# Patient Record
Sex: Male | Born: 1985 | Race: White | Hispanic: No | Marital: Single | Smoking: Current some day smoker
Health system: Southern US, Community
[De-identification: ages and names within clinical notes are randomized; demographics above are authoritative.]

---

## 2016-08-13 ENCOUNTER — Emergency Department (HOSPITAL_BASED_OUTPATIENT_CLINIC_OR_DEPARTMENT_OTHER): Payer: Worker's Compensation

## 2016-08-13 ENCOUNTER — Inpatient Hospital Stay (HOSPITAL_COMMUNITY): Payer: Worker's Compensation | Admitting: Anesthesiology

## 2016-08-13 ENCOUNTER — Encounter (HOSPITAL_COMMUNITY): Admission: EM | Disposition: A | Discharge status: Home / Self Care | Payer: Self-pay | Source: Home / Self Care

## 2016-08-13 ENCOUNTER — Inpatient Hospital Stay (HOSPITAL_BASED_OUTPATIENT_CLINIC_OR_DEPARTMENT_OTHER)
Admission: EM | Admit: 2016-08-13 | Discharge: 2016-08-15 | DRG: 958 | Disposition: A | Discharge status: Home / Self Care | Payer: Worker's Compensation | Attending: General Surgery | Admitting: General Surgery

## 2016-08-13 ENCOUNTER — Encounter (HOSPITAL_BASED_OUTPATIENT_CLINIC_OR_DEPARTMENT_OTHER): Payer: Self-pay | Admitting: *Deleted

## 2016-08-13 ENCOUNTER — Other Ambulatory Visit (HOSPITAL_BASED_OUTPATIENT_CLINIC_OR_DEPARTMENT_OTHER): Payer: Self-pay | Admitting: Radiology

## 2016-08-13 DIAGNOSIS — S27321A Contusion of lung, unilateral, initial encounter: Secondary | ICD-10-CM

## 2016-08-13 DIAGNOSIS — S52302A Unspecified fracture of shaft of left radius, initial encounter for closed fracture: Secondary | ICD-10-CM

## 2016-08-13 DIAGNOSIS — S52352A Displaced comminuted fracture of shaft of radius, left arm, initial encounter for closed fracture: Principal | ICD-10-CM | POA: Diagnosis present

## 2016-08-13 DIAGNOSIS — T1490XA Injury, unspecified, initial encounter: Secondary | ICD-10-CM

## 2016-08-13 DIAGNOSIS — S2242XA Multiple fractures of ribs, left side, initial encounter for closed fracture: Secondary | ICD-10-CM | POA: Diagnosis present

## 2016-08-13 DIAGNOSIS — S52252A Displaced comminuted fracture of shaft of ulna, left arm, initial encounter for closed fracture: Secondary | ICD-10-CM | POA: Diagnosis present

## 2016-08-13 DIAGNOSIS — S270XXA Traumatic pneumothorax, initial encounter: Secondary | ICD-10-CM | POA: Diagnosis present

## 2016-08-13 DIAGNOSIS — S20312A Abrasion of left front wall of thorax, initial encounter: Secondary | ICD-10-CM | POA: Diagnosis present

## 2016-08-13 DIAGNOSIS — F17200 Nicotine dependence, unspecified, uncomplicated: Secondary | ICD-10-CM | POA: Diagnosis present

## 2016-08-13 DIAGNOSIS — S63115A Dislocation of metacarpophalangeal joint of left thumb, initial encounter: Secondary | ICD-10-CM | POA: Diagnosis present

## 2016-08-13 DIAGNOSIS — T07XXXA Unspecified multiple injuries, initial encounter: Secondary | ICD-10-CM | POA: Diagnosis present

## 2016-08-13 DIAGNOSIS — S40812A Abrasion of left upper arm, initial encounter: Secondary | ICD-10-CM

## 2016-08-13 DIAGNOSIS — S5012XA Contusion of left forearm, initial encounter: Secondary | ICD-10-CM

## 2016-08-13 DIAGNOSIS — S30811A Abrasion of abdominal wall, initial encounter: Secondary | ICD-10-CM

## 2016-08-13 DIAGNOSIS — S36031A Moderate laceration of spleen, initial encounter: Secondary | ICD-10-CM | POA: Diagnosis present

## 2016-08-13 DIAGNOSIS — W208XXA Other cause of strike by thrown, projected or falling object, initial encounter: Secondary | ICD-10-CM | POA: Diagnosis present

## 2016-08-13 DIAGNOSIS — S50812A Abrasion of left forearm, initial encounter: Secondary | ICD-10-CM

## 2016-08-13 DIAGNOSIS — S301XXA Contusion of abdominal wall, initial encounter: Secondary | ICD-10-CM

## 2016-08-13 DIAGNOSIS — S36039A Unspecified laceration of spleen, initial encounter: Secondary | ICD-10-CM | POA: Diagnosis present

## 2016-08-13 DIAGNOSIS — S52202A Unspecified fracture of shaft of left ulna, initial encounter for closed fracture: Secondary | ICD-10-CM

## 2016-08-13 HISTORY — PX: OPEN REDUCTION INTERNAL FIXATION (ORIF) DISTAL RADIAL FRACTURE: SHX5989

## 2016-08-13 LAB — CBC
HEMATOCRIT: 41.3 % (ref 39.0–52.0)
HEMATOCRIT: 38.2 % — AB (ref 39.0–52.0)
Hemoglobin: 14.1 g/dL (ref 13.0–17.0)
Hemoglobin: 12.4 g/dL — ABNORMAL LOW (ref 13.0–17.0)
MCH: 30.3 pg (ref 26.0–34.0)
MCH: 28.7 pg (ref 26.0–34.0)
MCHC: 34.1 g/dL (ref 30.0–36.0)
MCHC: 32.5 g/dL (ref 30.0–36.0)
MCV: 88.6 fL (ref 78.0–100.0)
MCV: 88.4 fL (ref 78.0–100.0)
PLATELETS: 175 10*3/uL (ref 150–400)
Platelets: 173 10*3/uL (ref 150–400)
RBC: 4.66 MIL/uL (ref 4.22–5.81)
RBC: 4.32 MIL/uL (ref 4.22–5.81)
RDW: 13.5 % (ref 11.5–15.5)
RDW: 13.2 % (ref 11.5–15.5)
WBC: 14.7 10*3/uL — AB (ref 4.0–10.5)
WBC: 11.3 10*3/uL — ABNORMAL HIGH (ref 4.0–10.5)

## 2016-08-13 LAB — CBC WITH DIFFERENTIAL/PLATELET
Basophils Absolute: 0 10*3/uL (ref 0.0–0.1)
Basophils Relative: 0 %
EOS PCT: 1 %
Eosinophils Absolute: 0.2 10*3/uL (ref 0.0–0.7)
HCT: 43.7 % (ref 39.0–52.0)
HEMOGLOBIN: 15.2 g/dL (ref 13.0–17.0)
LYMPHS ABS: 5.4 10*3/uL — AB (ref 0.7–4.0)
Lymphocytes Relative: 35 %
MCH: 30 pg (ref 26.0–34.0)
MCHC: 34.8 g/dL (ref 30.0–36.0)
MCV: 86.4 fL (ref 78.0–100.0)
MONOS PCT: 5 %
Monocytes Absolute: 0.8 10*3/uL (ref 0.1–1.0)
Neutro Abs: 9.1 10*3/uL — ABNORMAL HIGH (ref 1.7–7.7)
Neutrophils Relative %: 59 %
PLATELETS: 306 10*3/uL (ref 150–400)
RBC: 5.06 MIL/uL (ref 4.22–5.81)
RDW: 13.2 % (ref 11.5–15.5)
WBC: 15.5 10*3/uL — AB (ref 4.0–10.5)

## 2016-08-13 LAB — SURGICAL PCR SCREEN
MRSA, PCR: NEGATIVE
STAPHYLOCOCCUS AUREUS: NEGATIVE

## 2016-08-13 LAB — BASIC METABOLIC PANEL
Anion gap: 9 (ref 5–15)
BUN: 21 mg/dL — AB (ref 6–20)
CHLORIDE: 106 mmol/L (ref 101–111)
CO2: 25 mmol/L (ref 22–32)
Calcium: 9.1 mg/dL (ref 8.9–10.3)
Creatinine, Ser: 1.13 mg/dL (ref 0.61–1.24)
GFR calc Af Amer: >60 mL/min (ref >60)
GFR calc non Af Amer: >60 mL/min (ref >60)
Glucose, Bld: 157 mg/dL — ABNORMAL HIGH (ref 65–99)
POTASSIUM: 3.3 mmol/L — AB (ref 3.5–5.1)
Sodium: 140 mmol/L (ref 135–145)

## 2016-08-13 LAB — MRSA PCR SCREENING: MRSA by PCR: NEGATIVE

## 2016-08-13 SURGERY — OPEN REDUCTION INTERNAL FIXATION (ORIF) DISTAL RADIUS FRACTURE
Anesthesia: Regional | Site: Arm Lower | Laterality: Left

## 2016-08-13 MED ORDER — PROPOFOL 10 MG/ML IV BOLUS
INTRAVENOUS | Status: AC
  Filled 2016-08-13: qty 20

## 2016-08-13 MED ORDER — FENTANYL CITRATE (PF) 100 MCG/2ML IJ SOLN
100.0000 ug | Freq: Once | INTRAMUSCULAR | Status: AC
  Administered 2016-08-13: 100 ug via INTRAVENOUS
  Filled 2016-08-13: qty 2

## 2016-08-13 MED ORDER — CEFAZOLIN SODIUM-DEXTROSE 1-4 GM/50ML-% IV SOLN
1.0000 g | Freq: Four times a day (QID) | INTRAVENOUS | Status: AC
  Administered 2016-08-13 – 2016-08-14 (×3): 1 g via INTRAVENOUS
  Filled 2016-08-13 (×4): qty 50

## 2016-08-13 MED ORDER — ONDANSETRON HCL 4 MG/2ML IJ SOLN: 4.0000 mg | Freq: Four times a day (QID) | INTRAMUSCULAR | Status: DC | PRN

## 2016-08-13 MED ORDER — SODIUM CHLORIDE 0.9 % IV SOLN: INTRAVENOUS | Status: DC

## 2016-08-13 MED ORDER — ONDANSETRON HCL 4 MG PO TABS: 4.0000 mg | ORAL_TABLET | Freq: Four times a day (QID) | ORAL | Status: DC | PRN

## 2016-08-13 MED ORDER — IOPAMIDOL (ISOVUE-300) INJECTION 61%
100.0000 mL | Freq: Once | INTRAVENOUS | Status: AC | PRN
  Administered 2016-08-13: 100 mL via INTRAVENOUS

## 2016-08-13 MED ORDER — FENTANYL CITRATE (PF) 100 MCG/2ML IJ SOLN
50.0000 ug | Freq: Once | INTRAMUSCULAR | Status: AC
  Administered 2016-08-13: 50 ug via INTRAVENOUS

## 2016-08-13 MED ORDER — LIDOCAINE HCL (CARDIAC) 20 MG/ML IV SOLN
INTRAVENOUS | Status: DC | PRN
Start: 2016-08-13 — End: 2016-08-13
  Administered 2016-08-13: 40 mg via INTRAVENOUS

## 2016-08-13 MED ORDER — BUPIVACAINE HCL (PF) 0.25 % IJ SOLN
INTRAMUSCULAR | Status: AC
  Filled 2016-08-13: qty 30

## 2016-08-13 MED ORDER — FENTANYL CITRATE (PF) 100 MCG/2ML IJ SOLN
INTRAMUSCULAR | Status: AC
  Administered 2016-08-13: 50 ug via INTRAVENOUS
  Filled 2016-08-13: qty 2

## 2016-08-13 MED ORDER — CEFAZOLIN SODIUM-DEXTROSE 2-4 GM/100ML-% IV SOLN
2.0000 g | INTRAVENOUS | Status: DC
  Filled 2016-08-13: qty 100

## 2016-08-13 MED ORDER — LACTATED RINGERS IV SOLN
INTRAVENOUS | Status: DC | PRN
  Administered 2016-08-13 (×2): via INTRAVENOUS

## 2016-08-13 MED ORDER — MIDAZOLAM HCL 2 MG/2ML IJ SOLN
1.0000 mg | Freq: Once | INTRAMUSCULAR | Status: AC
  Administered 2016-08-13: 1 mg via INTRAVENOUS

## 2016-08-13 MED ORDER — FENTANYL CITRATE (PF) 250 MCG/5ML IJ SOLN
INTRAMUSCULAR | Status: AC
  Filled 2016-08-13: qty 5

## 2016-08-13 MED ORDER — MIDAZOLAM HCL 2 MG/2ML IJ SOLN
INTRAMUSCULAR | Status: AC
  Filled 2016-08-13: qty 2

## 2016-08-13 MED ORDER — LIDOCAINE 2% (20 MG/ML) 5 ML SYRINGE
INTRAMUSCULAR | Status: AC
  Filled 2016-08-13: qty 5

## 2016-08-13 MED ORDER — PROPOFOL 10 MG/ML IV BOLUS
INTRAVENOUS | Status: DC | PRN
  Administered 2016-08-13: 150 mg via INTRAVENOUS

## 2016-08-13 MED ORDER — FENTANYL CITRATE (PF) 100 MCG/2ML IJ SOLN
100.0000 ug | INTRAMUSCULAR | Status: DC | PRN
  Administered 2016-08-13 (×2): 100 ug via INTRAVENOUS
  Filled 2016-08-13 (×2): qty 2

## 2016-08-13 MED ORDER — FENTANYL CITRATE (PF) 100 MCG/2ML IJ SOLN
INTRAMUSCULAR | Status: DC | PRN
  Administered 2016-08-13 (×3): 50 ug via INTRAVENOUS

## 2016-08-13 MED ORDER — BUPIVACAINE-EPINEPHRINE (PF) 0.5% -1:200000 IJ SOLN
INTRAMUSCULAR | Status: DC | PRN
  Administered 2016-08-13: 30 mL via PERINEURAL

## 2016-08-13 MED ORDER — METOCLOPRAMIDE HCL 5 MG PO TABS: 5.0000 mg | ORAL_TABLET | Freq: Three times a day (TID) | ORAL | Status: DC | PRN

## 2016-08-13 MED ORDER — KCL IN DEXTROSE-NACL 20-5-0.45 MEQ/L-%-% IV SOLN
INTRAVENOUS | Status: DC
  Administered 2016-08-13 – 2016-08-15 (×3): via INTRAVENOUS
  Filled 2016-08-13 (×6): qty 1000

## 2016-08-13 MED ORDER — PNEUMOCOCCAL VAC POLYVALENT 25 MCG/0.5ML IJ INJ
0.5000 mL | INJECTION | INTRAMUSCULAR | Status: AC
  Administered 2016-08-14: 0.5 mL via INTRAMUSCULAR
  Filled 2016-08-13: qty 0.5

## 2016-08-13 MED ORDER — HYDROMORPHONE HCL 1 MG/ML IJ SOLN
1.0000 mg | INTRAMUSCULAR | Status: DC | PRN
  Administered 2016-08-13 – 2016-08-15 (×10): 1 mg via INTRAVENOUS
  Filled 2016-08-13 (×10): qty 1

## 2016-08-13 MED ORDER — MIDAZOLAM HCL 5 MG/5ML IJ SOLN
INTRAMUSCULAR | Status: DC | PRN
  Administered 2016-08-13: 2 mg via INTRAVENOUS

## 2016-08-13 MED ORDER — POVIDONE-IODINE 10 % EX SWAB
2.0000 "application " | Freq: Once | CUTANEOUS | Status: AC
  Administered 2016-08-13: 2 via TOPICAL

## 2016-08-13 MED ORDER — ONDANSETRON HCL 4 MG/2ML IJ SOLN
INTRAMUSCULAR | Status: AC
  Filled 2016-08-13: qty 2

## 2016-08-13 MED ORDER — 0.9 % SODIUM CHLORIDE (POUR BTL) OPTIME
TOPICAL | Status: DC | PRN
  Administered 2016-08-13: 1000 mL

## 2016-08-13 MED ORDER — OXYCODONE HCL 5 MG PO TABS
10.0000 mg | ORAL_TABLET | ORAL | Status: DC | PRN
  Administered 2016-08-14 – 2016-08-15 (×8): 10 mg via ORAL
  Filled 2016-08-13 (×8): qty 2

## 2016-08-13 MED ORDER — NICOTINE 14 MG/24HR TD PT24
14.0000 mg | MEDICATED_PATCH | Freq: Every day | TRANSDERMAL | Status: DC
  Administered 2016-08-13 – 2016-08-15 (×3): 14 mg via TRANSDERMAL
  Filled 2016-08-13 (×3): qty 1

## 2016-08-13 MED ORDER — CEFAZOLIN SODIUM-DEXTROSE 1-4 GM/50ML-% IV SOLN
1.0000 g | Freq: Once | INTRAVENOUS | Status: AC
  Administered 2016-08-13: 1 g via INTRAVENOUS
  Filled 2016-08-13: qty 50

## 2016-08-13 MED ORDER — MIDAZOLAM HCL 2 MG/2ML IJ SOLN
INTRAMUSCULAR | Status: AC
  Administered 2016-08-13: 1 mg via INTRAVENOUS
  Filled 2016-08-13: qty 2

## 2016-08-13 MED ORDER — ONDANSETRON HCL 4 MG/2ML IJ SOLN
INTRAMUSCULAR | Status: DC | PRN
Start: 2016-08-13 — End: 2016-08-13
  Administered 2016-08-13: 4 mg via INTRAVENOUS

## 2016-08-13 MED ORDER — ONDANSETRON HCL 4 MG/2ML IJ SOLN
4.0000 mg | Freq: Once | INTRAMUSCULAR | Status: AC
  Administered 2016-08-13: 4 mg via INTRAVENOUS
  Filled 2016-08-13: qty 2

## 2016-08-13 MED ORDER — PANTOPRAZOLE SODIUM 40 MG PO TBEC
40.0000 mg | DELAYED_RELEASE_TABLET | Freq: Every day | ORAL | Status: DC
  Administered 2016-08-14 – 2016-08-15 (×2): 40 mg via ORAL
  Filled 2016-08-13 (×2): qty 1

## 2016-08-13 MED ORDER — OXYCODONE HCL 5 MG PO TABS: 5.0000 mg | ORAL_TABLET | ORAL | Status: DC | PRN

## 2016-08-13 MED ORDER — CHLORHEXIDINE GLUCONATE 4 % EX LIQD
60.0000 mL | Freq: Once | CUTANEOUS | Status: AC
  Administered 2016-08-13: 4 via TOPICAL
  Filled 2016-08-13: qty 60

## 2016-08-13 MED ORDER — HYDROMORPHONE HCL 1 MG/ML IJ SOLN
0.2500 mg | INTRAMUSCULAR | Status: DC | PRN
Start: 2016-08-13 — End: 2016-08-13

## 2016-08-13 MED ORDER — METOCLOPRAMIDE HCL 5 MG/ML IJ SOLN: 5.0000 mg | Freq: Three times a day (TID) | INTRAMUSCULAR | Status: DC | PRN

## 2016-08-13 MED ORDER — SODIUM CHLORIDE 0.9 % IV SOLN
Freq: Once | INTRAVENOUS | Status: AC
  Administered 2016-08-13: 02:00:00 via INTRAVENOUS

## 2016-08-13 MED ORDER — PANTOPRAZOLE SODIUM 40 MG IV SOLR
40.0000 mg | Freq: Every day | INTRAVENOUS | Status: DC
  Administered 2016-08-13: 40 mg via INTRAVENOUS
  Filled 2016-08-13: qty 40

## 2016-08-13 MED ORDER — ACETAMINOPHEN 325 MG PO TABS: 650.0000 mg | ORAL_TABLET | ORAL | Status: DC | PRN

## 2016-08-13 SURGICAL SUPPLY — 65 items
BANDAGE ACE 3X5.8 VEL STRL LF (GAUZE/BANDAGES/DRESSINGS) ×3 IMPLANT
BANDAGE ACE 4X5 VEL STRL LF (GAUZE/BANDAGES/DRESSINGS) ×3 IMPLANT
BIT DRILL 2.8 QUICK RELEASE (BIT) ×1 IMPLANT
BLADE CLIPPER SURG (BLADE) IMPLANT
BNDG ESMARK 4X9 LF (GAUZE/BANDAGES/DRESSINGS) ×3 IMPLANT
BNDG GAUZE ELAST 4 BULKY (GAUZE/BANDAGES/DRESSINGS) ×3 IMPLANT
CORDS BIPOLAR (ELECTRODE) ×3 IMPLANT
COVER SURGICAL LIGHT HANDLE (MISCELLANEOUS) ×3 IMPLANT
CUFF TOURNIQUET SINGLE 18IN (TOURNIQUET CUFF) ×3 IMPLANT
CUFF TOURNIQUET SINGLE 24IN (TOURNIQUET CUFF) IMPLANT
DECANTER SPIKE VIAL GLASS SM (MISCELLANEOUS) IMPLANT
DRAIN TLS ROUND 10FR (DRAIN) IMPLANT
DRAPE OEC MINIVIEW 54X84 (DRAPES) IMPLANT
DRAPE SURG 17X23 STRL (DRAPES) ×3 IMPLANT
DRILL 2.8 QUICK RELEASE (BIT) ×3
GAUZE SPONGE 4X4 12PLY STRL (GAUZE/BANDAGES/DRESSINGS) ×3 IMPLANT
GAUZE SPONGE 4X4 12PLY STRL LF (GAUZE/BANDAGES/DRESSINGS) ×3 IMPLANT
GAUZE SPONGE 4X4 16PLY XRAY LF (GAUZE/BANDAGES/DRESSINGS) ×3 IMPLANT
GAUZE XEROFORM 1X8 LF (GAUZE/BANDAGES/DRESSINGS) ×3 IMPLANT
GLOVE BIO SURGEON STRL SZ7.5 (GLOVE) ×3 IMPLANT
GLOVE BIOGEL PI IND STRL 8 (GLOVE) ×1 IMPLANT
GLOVE BIOGEL PI INDICATOR 8 (GLOVE) ×2
GOWN STRL REUS W/ TWL LRG LVL3 (GOWN DISPOSABLE) ×3 IMPLANT
GOWN STRL REUS W/ TWL XL LVL3 (GOWN DISPOSABLE) ×3 IMPLANT
GOWN STRL REUS W/TWL LRG LVL3 (GOWN DISPOSABLE) ×6
GOWN STRL REUS W/TWL XL LVL3 (GOWN DISPOSABLE) ×6
KIT BASIN OR (CUSTOM PROCEDURE TRAY) ×3 IMPLANT
KIT ROOM TURNOVER OR (KITS) ×3 IMPLANT
LOOP VESSEL MAXI BLUE (MISCELLANEOUS) IMPLANT
MANIFOLD NEPTUNE II (INSTRUMENTS) ×3 IMPLANT
NEEDLE 22X1 1/2 (OR ONLY) (NEEDLE) IMPLANT
NS IRRIG 1000ML POUR BTL (IV SOLUTION) ×3 IMPLANT
PACK ORTHO EXTREMITY (CUSTOM PROCEDURE TRAY) ×3 IMPLANT
PAD ARMBOARD 7.5X6 YLW CONV (MISCELLANEOUS) ×6 IMPLANT
PAD CAST 4YDX4 CTTN HI CHSV (CAST SUPPLIES) ×1 IMPLANT
PADDING CAST ABS 4INX4YD NS (CAST SUPPLIES) ×2
PADDING CAST ABS COTTON 4X4 ST (CAST SUPPLIES) ×1 IMPLANT
PADDING CAST COTTON 4X4 STRL (CAST SUPPLIES) ×2
PLATE 8 HOLE RADIUS (Plate) ×3 IMPLANT
PLATE ULNA 10HOLE (Plate) ×3 IMPLANT
PLATE ULNAR 130 10H LCKG (Plate) ×1 IMPLANT
SCREW BONE CORT 3.5X17MM HEXA (Screw) ×2 IMPLANT
SCREW CORTICAL 3.5X17 (Screw) ×6 IMPLANT
SCREW HEX 3.5X15 NLCKG STRL (Screw) ×3 IMPLANT
SCREW HEX 3.5X15MM (Screw) ×9 IMPLANT
SCREW HEXALOBE NON-LOCK 3.5X14 (Screw) ×9 IMPLANT
SCREW HEXALOBE NON-LOCK 3.5X16 (Screw) ×6 IMPLANT
SCREW NLCKG 13 3.5X13 HEXA (Screw) ×3 IMPLANT
SCREW NON-LOCK 3.5X13 (Screw) ×9 IMPLANT
SPLINT PLASTER EXTRA FAST 3X15 (CAST SUPPLIES) ×2
SPLINT PLASTER GYPS XFAST 3X15 (CAST SUPPLIES) ×1 IMPLANT
SPONGE LAP 4X18 X RAY DECT (DISPOSABLE) IMPLANT
SUT ETHILON 3 0 PS 1 (SUTURE) ×15 IMPLANT
SUT MNCRL AB 4-0 PS2 18 (SUTURE) ×3 IMPLANT
SUT PROLENE 3 0 PS 2 (SUTURE) IMPLANT
SUT VIC AB 3-0 FS2 27 (SUTURE) ×3 IMPLANT
SYR CONTROL 10ML LL (SYRINGE) IMPLANT
SYSTEM CHEST DRAIN TLS 7FR (DRAIN) IMPLANT
TOWEL OR 17X24 6PK STRL BLUE (TOWEL DISPOSABLE) ×3 IMPLANT
TOWEL OR 17X26 10 PK STRL BLUE (TOWEL DISPOSABLE) ×3 IMPLANT
TUBE CONNECTING 12'X1/4 (SUCTIONS) ×1
TUBE CONNECTING 12X1/4 (SUCTIONS) ×2 IMPLANT
TUBE EVACUATION TLS (MISCELLANEOUS) ×3 IMPLANT
UNDERPAD 30X30 (UNDERPADS AND DIAPERS) ×3 IMPLANT
WATER STERILE IRR 1000ML POUR (IV SOLUTION) ×3 IMPLANT

## 2016-08-13 NOTE — Anesthesia Postprocedure Evaluation (Addendum)
Anesthesia Post Note  Patient: Charlyn MinervaDevin R Lagasse  Procedure(s) Performed: Procedure(s) (LRB): OPEN REDUCTION INTERNAL FIXATION (ORIF) DISTAL RADIAL FRACTURE AND ULNA , CLOSED vs OPEN REDUCTION OF THUMB (Left)  Patient location during evaluation: PACU Anesthesia Type: Regional Level of consciousness: awake and alert Pain management: pain level controlled Vital Signs Assessment: post-procedure vital signs reviewed and stable Respiratory status: spontaneous breathing, nonlabored ventilation, respiratory function stable and patient connected to nasal cannula oxygen Cardiovascular status: blood pressure returned to baseline and stable Postop Assessment: no signs of nausea or vomiting Anesthetic complications: no       Last Vitals:  Vitals:   08/13/16 1700 08/13/16 1800  BP: 114/65 112/63  Pulse: 80 70  Resp: 18 16  Temp: 36.8 C     Last Pain:  Vitals:   08/13/16 1700  TempSrc: Oral  PainSc: 0-No pain                 Halynn Reitano

## 2016-08-13 NOTE — ED Provider Notes (Addendum)
[facility] Provider Note: Lowella Dell, MD, FACEP  CSN: [id number] MRN: [id number] ARRIVAL: 08/13/16 at 0149 ROOM: [facility]10/[facility]10   CHIEF COMPLAINT  Arm Injury and Rib Injury   HISTORY OF PRESENT ILLNESS  Arthur Clark is a 31 y.o. male who was struck by a falling aluminum in get at work about 45 minutes prior to arrival. [employer]. weighs about 165 pounds. In get struck him in his left axilla, the left side of his chest and abdomen, and his left arm and hand. He is having severe pain in his left chest, worse with deep breathing or palpation. He is having moderate pain in his left flank. He is having moderate to severe pain in his left forearm.  There are multiple abrasions of the left axilla and left side of the chest. There is a hematoma and abrasion to the left abdomen with associated tenderness. There is an obvious deformity of the mid left forearm with tense, soft tissue swelling proximal to the deformity. There is a deformity and tenderness of the base of the left thumb. Sensation is intact distally except for the left thumb. Tendon function evaluation is limited due to pain and swelling. He denies neck pain, mid back pain or injury to his right upper extremity or lower extremities.   History reviewed. No pertinent past medical history.  History reviewed. No pertinent surgical history.  History reviewed. No pertinent family history.  Social History  Substance Use Topics  . Smoking status: Current Some Day Smoker  . Smokeless tobacco: Never Used  . Alcohol use No    Prior to Admission medications   Not on File    Allergies Patient has no known allergies.   REVIEW OF SYSTEMS  Negative except as noted here or in the History of Present Illness.   PHYSICAL EXAMINATION  Initial Vital Signs Blood pressure 139/77, pulse (!) 58, temperature 98.3 F (36.8 C), resp. rate 14, height 6\' 4"  (1.93 m), weight 200 lb (90.7 kg), SpO2 99 %.  Examination General:  Well-developed, well-nourished male in no acute distress; appearance consistent with age of record HENT: normocephalic; atraumatic Eyes: pupils equal, round and reactive to light; extraocular muscles intact Neck: supple; no C-spine tenderness Heart: regular rate and rhythm Lungs: clear to auscultation bilaterally Chest: Left axillary and lateral chest wall abrasions and tenderness without crepitus:    Abdomen: soft; nondistended; no masses or hepatosplenomegaly; bowel sounds present; left-sided tenderness with left flank abrasion and hematoma:   Extremities: Mid left forearm deformity with generalized tenderness, soft tissue swelling and tenseness proximal to the deformity, deformity of base of left thumb with decreased range of motion, left radial artery +2 with decreased capillary refill distally:   Neurologic: Awake, alert and oriented; motor function intact in all extremities and symmetric; no facial droop; sensation intact in left second through fourth fingers but decreased in the left thumb Skin: Warm and dry; abrasions of left upper arm; trunk abrasions and hematoma noted above Psychiatric: Normal mood and affect   RESULTS  Summary of this visit's results, reviewed by myself:   EKG Interpretation  Date/Time:    Ventricular Rate:    PR Interval:    QRS Duration:   QT Interval:    QTC Calculation:   R Axis:     Text Interpretation:        Laboratory Studies: Results for orders placed or performed during the hospital encounter of 08/13/16 (from the past 24 hour(s))  CBC with Differential/Platelet  Status: Abnormal   Collection Time: 08/13/16  2:12 AM  Result Value Ref Range   WBC 15.5 (H) 4.0 - 10.5 K/uL   RBC 5.06 4.22 - 5.81 MIL/uL   Hemoglobin 15.2 13.0 - 17.0 g/dL   HCT 16.1 09.6 - 04.5 %   MCV 86.4 78.0 - 100.0 fL   MCH 30.0 26.0 - 34.0 pg   MCHC 34.8 30.0 - 36.0 g/dL   RDW 40.9 81.1 - 91.4 %   Platelets 306 150 - 400 K/uL   Neutrophils Relative % 59 %     Neutro Abs 9.1 (H) 1.7 - 7.7 K/uL   Lymphocytes Relative 35 %   Lymphs Abs 5.4 (H) 0.7 - 4.0 K/uL   Monocytes Relative 5 %   Monocytes Absolute 0.8 0.1 - 1.0 K/uL   Eosinophils Relative 1 %   Eosinophils Absolute 0.2 0.0 - 0.7 K/uL   Basophils Relative 0 %   Basophils Absolute 0.0 0.0 - 0.1 K/uL  Basic metabolic panel     Status: Abnormal   Collection Time: 08/13/16  2:12 AM  Result Value Ref Range   Sodium 140 135 - 145 mmol/L   Potassium 3.3 (L) 3.5 - 5.1 mmol/L   Chloride 106 101 - 111 mmol/L   CO2 25 22 - 32 mmol/L   Glucose, Bld 157 (H) 65 - 99 mg/dL   BUN 21 (H) 6 - 20 mg/dL   Creatinine, Ser 7.82 0.61 - 1.24 mg/dL   Calcium 9.1 8.9 - 95.6 mg/dL   GFR calc non Af Amer >60 >60 mL/min   GFR calc Af Amer >60 >60 mL/min   Anion gap 9 5 - 15   Imaging Studies: Dg Forearm Left  Result Date: 08/13/2016 CLINICAL DATA:  Construction injury EXAM: LEFT FOREARM - 2 VIEW COMPARISON:  None. FINDINGS: There are comminuted fractures of the midshaft left radius and ulna, predominantly oblique, with volar angulation. IMPRESSION: Comminuted fractures of the midshaft left radius and ulna with volar angulation. Electronically Signed   By: Deatra Robinson M.D.   On: 08/13/2016 03:44   Ct Chest W Contrast  Result Date: 08/13/2016 CLINICAL DATA:  Blunt trauma with enlarged pieces of metal fell on top of the patient. Shortness of breath and difficulty breathing. Multiple lacerations and pain on the left side of the body. EXAM: CT CHEST, ABDOMEN, AND PELVIS WITH CONTRAST TECHNIQUE: Multidetector CT imaging of the chest, abdomen and pelvis was performed following the standard protocol during bolus administration of intravenous contrast. CONTRAST:  ISOVUE-300 IOPAMIDOL (ISOVUE-300) INJECTION 61% COMPARISON:  None. FINDINGS: CT CHEST FINDINGS Cardiovascular: No significant vascular findings. Normal heart size. No pericardial effusion. Mediastinum/Nodes: No enlarged mediastinal, hilar, or axillary  lymph nodes. Thyroid gland, trachea, and esophagus demonstrate no significant findings. Lungs/Pleura: There are left basilar opacities subjacent to multiple rib fractures, likely pulmonary contusion. There is a trace left anterior pneumothorax, (series 3, image 162). This measures approximately 3 mm. Musculoskeletal: There are nondisplaced fractures of the lateral left ninth and tenth ribs with small amounts of adjacent soft tissue emphysema. CT ABDOMEN PELVIS FINDINGS Hepatobiliary: No focal liver abnormality is seen. No gallstones, gallbladder wall thickening, or biliary dilatation. Pancreas: Unremarkable. No pancreatic ductal dilatation or surrounding inflammatory changes. Spleen: There is a wedge-shaped focus of hypoattenuation at the posterior aspect of the spleen. There is a hematoma within the subcutaneous fat overlying the spleen, an a fracture of the adjacent left tenth rib. Adrenals/Urinary Tract: Adrenal glands are unremarkable. Kidneys are normal, without renal  calculi, focal lesion, or hydronephrosis. Bladder is unremarkable. Stomach/Bowel: Stomach is within normal limits. Appendix appears normal. No evidence of bowel wall thickening, distention, or inflammatory changes. Vascular/Lymphatic: No significant vascular findings are present. No enlarged abdominal or pelvic lymph nodes. Reproductive: Prostate is unremarkable. Other: Hematoma within the left flank subcutaneous fat. Musculoskeletal: There is no fracture of the pelvis. At the posterior inferior corner of the L4 vertebral body, there is a well corticated osseous fragment indenting the ventral thecal sac. This fragment is well corticated and appears chronic. No lumbar spine fracture is identified. Fractures of the left radius and ulna are better characterized on concomitant radiographs. IMPRESSION: 1. Focal hypoattenuation within the posterolateral aspect of the spleen, measuring up to 2 cm in depth, consistent with grade 2 splenic laceration. 2.  Nondisplaced fractures of the left ninth and tenth ribs with associated soft tissue emphysema and contusion of the underlying lung. 3. Trace left anterior pneumothorax measuring approximately 3 mm. 4. Corticated osseous fragment at the inferior posterior corner of the L4 vertebral body, impressing on the ventral thecal sac. This has a chronic appearance and may be related to remote trauma or herniation of the nucleus pulposis through the endplate. 5. Fractures of the left radius and ulna are better characterized on concomitant radiographs. 6. Left flank hematoma within the subcutaneous fat overlying the spleen. Critical Value/emergent results were called by telephone at the time of interpretation on 08/13/2016 at 3:40 am to Dr. Paula Libra , who verbally acknowledged these results. Electronically Signed   By: Deatra Robinson M.D.   On: 08/13/2016 03:42   Ct Abdomen Pelvis W Contrast  Result Date: 08/13/2016 CLINICAL DATA:  Blunt trauma with enlarged pieces of metal fell on top of the patient. Shortness of breath and difficulty breathing. Multiple lacerations and pain on the left side of the body. EXAM: CT CHEST, ABDOMEN, AND PELVIS WITH CONTRAST TECHNIQUE: Multidetector CT imaging of the chest, abdomen and pelvis was performed following the standard protocol during bolus administration of intravenous contrast. CONTRAST:  ISOVUE-300 IOPAMIDOL (ISOVUE-300) INJECTION 61% COMPARISON:  None. FINDINGS: CT CHEST FINDINGS Cardiovascular: No significant vascular findings. Normal heart size. No pericardial effusion. Mediastinum/Nodes: No enlarged mediastinal, hilar, or axillary lymph nodes. Thyroid gland, trachea, and esophagus demonstrate no significant findings. Lungs/Pleura: There are left basilar opacities subjacent to multiple rib fractures, likely pulmonary contusion. There is a trace left anterior pneumothorax, (series 3, image 162). This measures approximately 3 mm. Musculoskeletal: There are nondisplaced  fractures of the lateral left ninth and tenth ribs with small amounts of adjacent soft tissue emphysema. CT ABDOMEN PELVIS FINDINGS Hepatobiliary: No focal liver abnormality is seen. No gallstones, gallbladder wall thickening, or biliary dilatation. Pancreas: Unremarkable. No pancreatic ductal dilatation or surrounding inflammatory changes. Spleen: There is a wedge-shaped focus of hypoattenuation at the posterior aspect of the spleen. There is a hematoma within the subcutaneous fat overlying the spleen, an a fracture of the adjacent left tenth rib. Adrenals/Urinary Tract: Adrenal glands are unremarkable. Kidneys are normal, without renal calculi, focal lesion, or hydronephrosis. Bladder is unremarkable. Stomach/Bowel: Stomach is within normal limits. Appendix appears normal. No evidence of bowel wall thickening, distention, or inflammatory changes. Vascular/Lymphatic: No significant vascular findings are present. No enlarged abdominal or pelvic lymph nodes. Reproductive: Prostate is unremarkable. Other: Hematoma within the left flank subcutaneous fat. Musculoskeletal: There is no fracture of the pelvis. At the posterior inferior corner of the L4 vertebral body, there is a well corticated osseous fragment indenting the ventral thecal sac.  This fragment is well corticated and appears chronic. No lumbar spine fracture is identified. Fractures of the left radius and ulna are better characterized on concomitant radiographs. IMPRESSION: 1. Focal hypoattenuation within the posterolateral aspect of the spleen, measuring up to 2 cm in depth, consistent with grade 2 splenic laceration. 2. Nondisplaced fractures of the left ninth and tenth ribs with associated soft tissue emphysema and contusion of the underlying lung. 3. Trace left anterior pneumothorax measuring approximately 3 mm. 4. Corticated osseous fragment at the inferior posterior corner of the L4 vertebral body, impressing on the ventral thecal sac. This has a  chronic appearance and may be related to remote trauma or herniation of the nucleus pulposis through the endplate. 5. Fractures of the left radius and ulna are better characterized on concomitant radiographs. 6. Left flank hematoma within the subcutaneous fat overlying the spleen. Critical Value/emergent results were called by telephone at the time of interpretation on 08/13/2016 at 3:40 am to Dr. Paula LibraJOHN Jaison Petraglia , who verbally acknowledged these results. Electronically Signed   By: Deatra RobinsonKevin  Herman M.D.   On: 08/13/2016 03:42   Dg Hand Complete Left  Result Date: 08/13/2016 CLINICAL DATA:  Construction injury EXAM: LEFT HAND - COMPLETE 3+ VIEW COMPARISON:  None. FINDINGS: There is lateral dislocation at the left metacarpophalangeal joint of the thumb. There is a small adjacent osseous fragment that likely indicates fracture, but the donor site is not identified. There is no other fracture of the left hand. IMPRESSION: Lateral dislocation at the left first metacarpophalangeal joint. An associated chip fracture is suspected, but the donor site is not visualized. Electronically Signed   By: Deatra RobinsonKevin  Herman M.D.   On: 08/13/2016 03:46    ED COURSE  Nursing notes and initial vitals signs, including pulse oximetry, reviewed.  Vitals:   08/13/16 0220 08/13/16 0222 08/13/16 0330 08/13/16 0400  BP: 136/81  (!) 136/32 128/68  Pulse: (!) 55  72 61  Resp: 16  16 16   Temp:      SpO2: 100% 98% 100% 100%  Weight:      Height:       4:18 AM Discussed with Dr. Violeta GelinasBurke Thompson of trauma surgery who accepts the patient [facility]. Discussed with Dr. Betha LoaKevin Kuzma of hand surgery who will see the patient on arrival. Due to the tenseness of his left forearm soft tissue I am concerned about an impending compartment syndrome and told Dr. Merlyn LotKuzma that I anticipate he will want to perform surgery on an urgent basis. Capillary refill has not worsened since arrival  PROCEDURES   CRITICAL  CARE Performed by: Sincere Liuzzi L Total critical care time: 75 minutes Critical care time was exclusive of separately billable procedures and treating other patients. Critical care was necessary to treat or prevent imminent or life-threatening deterioration. Critical care was time spent personally by me on the following activities: development of treatment plan with patient and/or surrogate as well as nursing, discussions with consultants, evaluation of patient's response to treatment, examination of patient, obtaining history from patient or surrogate, ordering and performing treatments and interventions, ordering and review of laboratory studies, ordering and review of radiographic studies, pulse oximetry and re-evaluation of patient's condition.   Left forearm sugar tong splint applied by tech. Fingers remain neurovascularly intact to that of the initial examination.  ED DIAGNOSES     ICD-9-CM ICD-10-CM   1. Accidentally struck by falling object, initial encounter [phone] W20.8XXA   2. Trauma 959.9 T14.90XA CT  CHEST W CONTRAST     CT CHEST W CONTRAST  3. Splenic laceration, initial encounter 865.09 S36.039A   4. Fracture of ribs, two, left, closed, initial encounter 807.02 S22.42XA   5. Contusion of left lung, initial encounter 861.21 S27.321A   6. Traumatic pneumothorax without open wound into thorax, initial encounter 860.0 S27.0XXA   7. Abrasion of left chest wall, initial encounter 911.0 S20.312A   8. Abrasion of abdominal wall, initial encounter 911.0 S30.811A   9. Abdominal wall hematoma, initial encounter 922.2 S30.1XXA   10. Dislocation of metacarpophalangeal joint of left thumb, initial encounter 834.01 S63.115A   11. Traumatic hematoma of left forearm, initial encounter 923.10 S50.12XA   12. Abrasion of left forearm, initial encounter 913.0 S50.812A   13. Abrasion of left upper arm, initial encounter 912.0 S40.812A   14. Fracture, radius and ulna, shaft, left, closed, initial  encounter 813.23 S52.202A     S52.302A   15. Abrasion of left axilla, initial encounter 912.0 Z61.096E        Paula Libra, MD 08/13/16 0423    Paula Libra, MD 08/13/16 [phone]

## 2016-08-13 NOTE — ED Triage Notes (Signed)
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

## 2016-08-13 NOTE — Clinical Social Work Note (Addendum)
Clinical Social Work Assessment  Patient Details  Name: Arthur Clark MRN: [id number] Date of Birth: 05/17/1985  Date of referral:  08/13/16               Reason for consult:  Trauma                Permission sought to share information with:    Permission granted to share information::  No  Name::        Agency::     Relationship::     Contact Information:     Housing/Transportation Living arrangements for the past 2 months:  Single Family Home Source of Information:  Patient, Medical Team Patient Interpreter Needed:  None Criminal Activity/Legal Involvement Pertinent to Current Situation/Hospitalization:  No - Comment as needed Significant Relationships:  Significant Other Lives with:  Significant Other Do you feel safe going back to the place where you live?  Yes Need for family participation in patient care:  Yes (Comment)  Care giving concerns:  Trauma patient. SBIRT completed.   Social Worker assessment / plan:  CSW met with patient. No supports at bedside. CSW inquired about any social work-related needs. Patient initially declined. Patient provided information on what led to admission. No alcohol involved. Patient was at work and the incident occurred at 1:00 am. Patient states that he has 1-2 drinks on weekends when with friends. Patient reports heavy drinking from age [age] until his father committed suicide and he decided to change his life. SBIRT completed. Following discussion patient asked if CSW helped with child support. CSW will do some research to see if there are any resources that the patient will benefit from because he will likely be out of work for three weeks and will be in a cast for three months. No further concerns. CSW encouraged patient to contact CSW as needed. CSW will continue to follow patient for support and provide patient with resources if any are located.  11:59 am: CSW provided patient with phone number to speak with someone regarding child support  in [facility]. Per online resources, patient may be able to file for a temporary modification to his child support order.  Employment status:  Temporary Insurance information:  Other (Comment Required) (Worker's comp) PT Recommendations:  Not assessed at this time Information / Referral to community resources:  SBIRT  Patient/Family's Response to care:  Patient agreeable to discussing incident and completing SBIRT. Patient's girlfriend supportive and involved in patient's care. Patient appreciated social work intervention.  Patient/Family's Understanding of and Emotional Response to Diagnosis, Current Treatment, and Prognosis:  Patient has a good understanding of the reason for admission. Patient appears happy with hospital care but is ready for his 1:00 surgery.  Emotional Assessment Appearance:  Appears stated age Attitude/Demeanor/Rapport:  Other (Pleasant) Affect (typically observed):  Accepting, Appropriate, Calm, Pleasant Orientation:  Oriented to Self, Oriented to Place, Oriented to  Time, Oriented to Situation Alcohol / Substance use:  Alcohol Use Psych involvement (Current and /or in the community):     Discharge Needs  Concerns to be addressed:  No discharge needs identified Readmission within the last 30 days:  No Current discharge risk:  None Barriers to Discharge:  Continued Medical Work up   Candie Chroman, LCSW 08/13/2016, 10:54 AM

## 2016-08-13 NOTE — Op Note (Signed)
NAMN[place]:  Lillo, Koy                ACCOUNT NO.:  [id number]  MEDICAL RECORD NO.:  [id number]  LOCATION:  [id number]                        FACILITY:  [facility]  PHYSICIAN:  Betha LoaKevin Lam Bjorklund, MD        DATE OF BIRTH:  Apr 04, 1985  DATE OF PROCEDURE:  08/13/2016 DATE OF DISCHARGE:                              OPERATIVE REPORT   PREOPERATIVE DIAGNOSES:  Left radial and ulnar shaft fractures and left thumb metacarpophalangeal joint dorsal dislocation.  POSTOPERATIVE DIAGNOSES:  Left radial and ulnar shaft fractures and left thumb metacarpophalangeal joint dorsal dislocation.  PROCEDURE:   1. Open reduction and internal fixation of left radius and ulna midshaft fractures, primarily transverse in nature with incomplete butterfly fragmentation 2. Closed reduction of left thumb metacarpophalangeal joint dislocation.  SURGEON:  Betha LoaKevin Demaria Deeney, MD  ASSISTANT:  Cindee SaltGary Amad Mau, M.D.  ANESTHESIA:  General with regional.  IV FLUIDS:  Per Anesthesia flow sheet.  ESTIMATED BLOOD LOSS:  Minimal.  COMPLICATIONS:  None.  SPECIMENS:  None.  TOURNIQUET TIME:  115 minutes.  DISPOSITION:  Stable to PACU.  INDICATIONS:  Mr. Redmond BasemanHayden is a 31 year old, right-hand dominant male who was injured when a heavy piece of metal hit him at work early this morning.  He was [facility] at [facility], where radiographs were taken, revealing a radial and ulnar shaft fractures and thumb MP joint dislocation.  [facility] to be admitted by the Trauma Service for other injuries.  I recommended operative fixation of the fractures and closed versus open reduction of the thumb MP joint. Risks, benefits, and alternatives of surgery were discussed including the risk of blood loss; infection; damage to nerves, vessels, tendons, ligaments, bone; failure of surgery; need for additional surgery; complications with wound healing; continued pain; nonunion; malunion; stiffness.  He voiced understanding of these risks and  elected to proceed.  DESCRIPTION OF PROCEDURE:  After being identified preoperatively by myself, the patient and I agreed upon procedure and site of the procedure.  Surgical site was marked.  The risks, benefits, and alternatives of surgery were reviewed and he wished to proceed. Surgical consent had been signed.  He was given IV Ancef as preoperative antibiotic prophylaxis.  He was transported to the operating room and placed on the operating room table in supine position with the left upper extremity on arm board.  General anesthesia induced by anesthesiologist.  A regional block had been performed by Anesthesia in preoperative holding.  Left upper extremity was prepped and draped in normal sterile orthopedic fashion.  Surgical pause was performed between surgeons, Anesthesia, and operating room staff; and all were in agreement as to the patient, procedure, and site of the procedure. Tourniquet at the proximal aspect of the extremity was inflated to 250 mmHg after exsanguination of the limb with an Esmarch bandage.  Incision was made at the volar radial aspect of the forearm and carried into subcutaneous tissues by spreading technique.  Bipolar electrocautery was used to obtain hemostasis.  The FCR and FPL muscles were brought ulnarly and the radial artery taken radially.  The fracture site was identified. The periosteal elevators and Therapist, nutritionalreer elevator were used to clear off the bone and  soft tissue.  The bone clamps were used to reduce the fracture under direct visualization.  An Acumed radial shaft plate was selected and secured to the bone with the clamps.  C-arm was used in AP and lateral projections to ensure appropriate reduction and position of the hardware, which was the case.  Standard AO drilling and measuring technique was used.  An 8-hole plate was used.  The proximal and distal 3 holes were filled.  The central 2 holes were left open due to the butterfly fragment.  This  fragment was not complete and was stable.  C- arm was used in AP and lateral projections to ensure appropriate reduction and position of the hardware which was the case.  Attention was turned to the ulna.  Incision was made at the ulnar side of the forearm.  This was carried into subcutaneous tissues again by spreading technique.  Bipolar electrocautery was used to obtain hemostasis.  The subcutaneous border of the ulna was identified and sharply incised.  The periosteum was elevated.  The fracture was again reduced under direct visualization.  There was a fracture line going both proximally and distally, but these were incomplete and the bone was stable.  The fracture was reduced under direct visualization.  A 10-hole ulnar plate was selected.  Standard AO drilling and measuring technique was used. The distal 3 and proximal 4 holes were filled.  Good purchase was obtained in all screws.  C-arm was used in AP and lateral projections to ensure appropriate reduction and position of the hardware, which was the case.  The forearm was able to be pronated and supinated.  The wounds were copiously irrigated with sterile saline.  The soft tissue was repaired back over top of the ulnar plate using 3-0 Vicryl suture in a running fashion.  The skin was closed with 3-0 nylon in a horizontal mattress fashion.  Three inverted interrupted Vicryl sutures were placed in subcutaneous tissues volarly and the skin closed with 3-0 nylon in a horizontal mattress fashion.  At the start of the case, the thumb MP joint dislocation was reduced in a closed fashion.  The radial and ulnar collateral ligaments were stable after the reduction.  The tourniquet was deflated at 115 minutes.  Fingertips were pink with brisk capillary refill after deflation of the tourniquet.  The operative drapes were broken down and the patient was awakened from anesthesia safely.  He was transferred back to stretcher and taken to the PACU  in stable condition. I will see him back in the office in 1 week for postoperative followup. He was admitted to the Trauma Service.  He is stable for discharge from a Hand Surgery standpoint.     Betha Loa, MD     KK/MEDQ  D:  08/13/2016  T:  08/13/2016  Job:  [id number]

## 2016-08-13 NOTE — Op Note (Signed)
I assisted Surgeon(s) and Role:    * Betha LoaKuzma, Kevin, MD - Primary    Cindee Salt* Anetra Czerwinski, MD - Assisting on the Procedure(s): OPEN REDUCTION INTERNAL FIXATION (ORIF) DISTAL RADIAL FRACTURE AND ULNA , CLOSED vs OPEN REDUCTION OF THUMB on 08/13/2016.  I provided assistance on this case as follows: approach, indentification and debridement of the fracture, reduction of the fractures of the radius and ulna, plate fixation of each bone, closure of the wounds and application of the dressings and splints. I also assisted in reduction of the dislocation, I was present for the entire case.  Electronically signed by: Nicki ReaperKUZMA,Sylvanna Burggraf R, MD Date: 08/13/2016 Time: 3:45 PM

## 2016-08-13 NOTE — H&P (Signed)
Arthur Clark is an 31 y.o. male.   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 The bundle of aluminum struck him along his left chest wall. It weighed about 150 pounds. He had no loss of consciousness but also injured his left arm. He was evaluated at [facility] and found to have left rib fracture 9 & 10 with trace pneumothorax, grade 2 spleen laceration with no active extravasation, a left both bones forearm fracture, and a left thumb dislocation. I accepted him in transfer for admission to the trauma service.  History reviewed. No pertinent past medical history.  History reviewed. No pertinent surgical history.  History reviewed. No pertinent family history. Social History:  reports that he has been smoking.  He has never used smokeless tobacco. He reports that he does not drink alcohol or use drugs.  Allergies: No Known Allergies  No prescriptions prior to admission.    Results for orders placed or performed during the hospital encounter of 08/13/16 (from the past 48 hour(s))  CBC with Differential/Platelet     Status: Abnormal   Collection Time: 08/13/16  2:12 AM  Result Value Ref Range   WBC 15.5 (H) 4.0 - 10.5 K/uL   RBC 5.06 4.22 - 5.81 MIL/uL   Hemoglobin 15.2 13.0 - 17.0 g/dL   HCT 43.7 39.0 - 52.0 %   MCV 86.4 78.0 - 100.0 fL   MCH 30.0 26.0 - 34.0 pg   MCHC 34.8 30.0 - 36.0 g/dL   RDW 13.2 11.5 - 15.5 %   Platelets 306 150 - 400 K/uL   Neutrophils Relative % 59 %   Neutro Abs 9.1 (H) 1.7 - 7.7 K/uL   Lymphocytes Relative 35 %   Lymphs Abs 5.4 (H) 0.7 - 4.0 K/uL   Monocytes Relative 5 %   Monocytes Absolute 0.8 0.1 - 1.0 K/uL   Eosinophils Relative 1 %   Eosinophils Absolute 0.2 0.0 - 0.7 K/uL   Basophils Relative 0 %   Basophils Absolute 0.0 0.0 - 0.1 K/uL  Basic metabolic panel     Status: Abnormal   Collection Time: 08/13/16  2:12 AM  Result  Value Ref Range   Sodium 140 135 - 145 mmol/L   Potassium 3.3 (L) 3.5 - 5.1 mmol/L   Chloride 106 101 - 111 mmol/L   CO2 25 22 - 32 mmol/L   Glucose, Bld 157 (H) 65 - 99 mg/dL   BUN 21 (H) 6 - 20 mg/dL   Creatinine, Ser 1.13 0.61 - 1.24 mg/dL   Calcium 9.1 8.9 - 10.3 mg/dL   GFR calc non Af Amer >60 >60 mL/min   GFR calc Af Amer >60 >60 mL/min    Comment: (NOTE) The eGFR has been calculated using the CKD EPI equation. This calculation has not been validated in all clinical situations. eGFR's persistently <60 mL/min signify possible Chronic Kidney Disease.    Anion gap 9 5 - 15   Dg Forearm Left  Result Date: 08/13/2016 CLINICAL DATA:  Construction injury EXAM: LEFT FOREARM - 2 VIEW COMPARISON:  None. FINDINGS: There are comminuted fractures of the midshaft left radius and ulna, predominantly oblique, with volar angulation. IMPRESSION: Comminuted fractures of the midshaft left radius and ulna with volar angulation. Electronically Signed   By: Ulyses Jarred M.D.   On: 08/13/2016 03:44   Ct Chest W Contrast  Result Date: 08/13/2016 CLINICAL DATA:  Blunt trauma with enlarged pieces of metal fell on top of the patient. Shortness of breath and difficulty breathing. Multiple lacerations and pain on the left side of the body. EXAM: CT CHEST, ABDOMEN, AND PELVIS WITH CONTRAST TECHNIQUE: Multidetector CT imaging of the chest, abdomen and pelvis was performed following the standard protocol during bolus administration of intravenous contrast. CONTRAST:  154m ISOVUE-300 IOPAMIDOL (ISOVUE-300) INJECTION 61% COMPARISON:  None. FINDINGS: CT CHEST FINDINGS Cardiovascular: No significant vascular findings. Normal heart size. No pericardial effusion. Mediastinum/Nodes: No enlarged mediastinal, hilar, or axillary lymph nodes. Thyroid gland, trachea, and esophagus demonstrate no significant findings. Lungs/Pleura: There are left basilar opacities subjacent to multiple rib fractures, likely pulmonary contusion.  There is a trace left anterior pneumothorax, (series 3, image 162). This measures approximately 3 mm. Musculoskeletal: There are nondisplaced fractures of the lateral left ninth and tenth ribs with small amounts of adjacent soft tissue emphysema. CT ABDOMEN PELVIS FINDINGS Hepatobiliary: No focal liver abnormality is seen. No gallstones, gallbladder wall thickening, or biliary dilatation. Pancreas: Unremarkable. No pancreatic ductal dilatation or surrounding inflammatory changes. Spleen: There is a wedge-shaped focus of hypoattenuation at the posterior aspect of the spleen. There is a hematoma within the subcutaneous fat overlying the spleen, an a fracture of the adjacent left tenth rib. Adrenals/Urinary Tract: Adrenal glands are unremarkable. Kidneys are normal, without renal calculi, focal lesion, or hydronephrosis. Bladder is unremarkable. Stomach/Bowel: Stomach is within normal limits. Appendix appears normal. No evidence of bowel wall thickening, distention, or inflammatory changes. Vascular/Lymphatic: No significant vascular findings are present. No enlarged abdominal or pelvic lymph nodes. Reproductive: Prostate is unremarkable. Other: Hematoma within the left flank subcutaneous fat. Musculoskeletal: There is no fracture of the pelvis. At the posterior inferior corner of the L4 vertebral body, there is a well corticated osseous fragment indenting the ventral thecal sac. This fragment is well corticated and appears chronic. No lumbar spine fracture is identified. Fractures of the left radius and ulna are better characterized on concomitant radiographs. IMPRESSION: 1. Focal hypoattenuation within the posterolateral aspect of the spleen, measuring up to 2 cm in depth, consistent with grade 2 splenic laceration. 2. Nondisplaced fractures of the left ninth and tenth ribs with associated soft tissue emphysema and contusion of the underlying lung. 3. Trace left anterior pneumothorax measuring approximately 3 mm. 4.  Corticated osseous fragment at the inferior posterior corner of the L4 vertebral body, impressing on the ventral thecal sac. This has a chronic appearance and may be related to remote trauma or herniation of the nucleus pulposis through the endplate. 5. Fractures of the left radius and ulna are better characterized on concomitant radiographs. 6. Left flank hematoma within the subcutaneous fat overlying the spleen. Critical Value/emergent results were called by telephone at the time of interpretation on 08/13/2016 at 3:40 am to Dr. JShanon Rosser, who verbally acknowledged these results. Electronically Signed   By: KUlyses JarredM.D.   On: 08/13/2016 03:42   Ct Abdomen Pelvis W Contrast  Result Date: 08/13/2016 CLINICAL DATA:  Blunt trauma with enlarged pieces of metal fell on top of the patient. Shortness of breath and difficulty breathing. Multiple lacerations and pain on the left side of the body. EXAM: CT CHEST, ABDOMEN, AND PELVIS WITH CONTRAST TECHNIQUE: Multidetector CT imaging of the chest, abdomen and pelvis was performed following the standard protocol during bolus administration of intravenous contrast. CONTRAST:  1039mISOVUE-300 IOPAMIDOL (ISOVUE-300) INJECTION 61% COMPARISON:  None. FINDINGS: CT CHEST FINDINGS Cardiovascular: No  significant vascular findings. Normal heart size. No pericardial effusion. Mediastinum/Nodes: No enlarged mediastinal, hilar, or axillary lymph nodes. Thyroid gland, trachea, and esophagus demonstrate no significant findings. Lungs/Pleura: There are left basilar opacities subjacent to multiple rib fractures, likely pulmonary contusion. There is a trace left anterior pneumothorax, (series 3, image 162). This measures approximately 3 mm. Musculoskeletal: There are nondisplaced fractures of the lateral left ninth and tenth ribs with small amounts of adjacent soft tissue emphysema. CT ABDOMEN PELVIS FINDINGS Hepatobiliary: No focal liver abnormality is seen. No gallstones,  gallbladder wall thickening, or biliary dilatation. Pancreas: Unremarkable. No pancreatic ductal dilatation or surrounding inflammatory changes. Spleen: There is a wedge-shaped focus of hypoattenuation at the posterior aspect of the spleen. There is a hematoma within the subcutaneous fat overlying the spleen, an a fracture of the adjacent left tenth rib. Adrenals/Urinary Tract: Adrenal glands are unremarkable. Kidneys are normal, without renal calculi, focal lesion, or hydronephrosis. Bladder is unremarkable. Stomach/Bowel: Stomach is within normal limits. Appendix appears normal. No evidence of bowel wall thickening, distention, or inflammatory changes. Vascular/Lymphatic: No significant vascular findings are present. No enlarged abdominal or pelvic lymph nodes. Reproductive: Prostate is unremarkable. Other: Hematoma within the left flank subcutaneous fat. Musculoskeletal: There is no fracture of the pelvis. At the posterior inferior corner of the L4 vertebral body, there is a well corticated osseous fragment indenting the ventral thecal sac. This fragment is well corticated and appears chronic. No lumbar spine fracture is identified. Fractures of the left radius and ulna are better characterized on concomitant radiographs. IMPRESSION: 1. Focal hypoattenuation within the posterolateral aspect of the spleen, measuring up to 2 cm in depth, consistent with grade 2 splenic laceration. 2. Nondisplaced fractures of the left ninth and tenth ribs with associated soft tissue emphysema and contusion of the underlying lung. 3. Trace left anterior pneumothorax measuring approximately 3 mm. 4. Corticated osseous fragment at the inferior posterior corner of the L4 vertebral body, impressing on the ventral thecal sac. This has a chronic appearance and may be related to remote trauma or herniation of the nucleus pulposis through the endplate. 5. Fractures of the left radius and ulna are better characterized on concomitant  radiographs. 6. Left flank hematoma within the subcutaneous fat overlying the spleen. Critical Value/emergent results were called by telephone at the time of interpretation on 08/13/2016 at 3:40 am to Dr. Shanon Rosser , who verbally acknowledged these results. Electronically Signed   By: Ulyses Jarred M.D.   On: 08/13/2016 03:42   Dg Hand Complete Left  Result Date: 08/13/2016 CLINICAL DATA:  Construction injury EXAM: LEFT HAND - COMPLETE 3+ VIEW COMPARISON:  None. FINDINGS: There is lateral dislocation at the left metacarpophalangeal joint of the thumb. There is a small adjacent osseous fragment that likely indicates fracture, but the donor site is not identified. There is no other fracture of the left hand. IMPRESSION: Lateral dislocation at the left first metacarpophalangeal joint. An associated chip fracture is suspected, but the donor site is not visualized. Electronically Signed   By: Ulyses Jarred M.D.   On: 08/13/2016 03:46    Review of Systems  Constitutional: Negative for chills and fever.  HENT: Negative.   Eyes: Negative for blurred vision and double vision.  Respiratory: Negative for shortness of breath and wheezing.   Cardiovascular: Positive for chest pain.       L chest wall  Gastrointestinal: Negative for abdominal pain, nausea and vomiting.  Genitourinary: Negative.   Musculoskeletal:       Left  forearm and thumb pain  Skin:       abrasions  Neurological: Negative.   Endo/Heme/Allergies: Negative.   Psychiatric/Behavioral: Negative.     Blood pressure 133/73, pulse 74, temperature 98.9 F (37.2 C), temperature source Oral, resp. rate 15, height 6' 4"  (1.93 m), weight 84.3 kg (185 lb 13.6 oz), SpO2 98 %. Physical Exam  Constitutional: He is oriented to person, place, and time. He appears well-developed and well-nourished. No distress.  HENT:  Head: Normocephalic.  Right Ear: External ear normal.  Left Ear: External ear normal.  Nose: Nose normal.  Mouth/Throat:  Oropharynx is clear and moist.  Eyes: Conjunctivae and EOM are normal. Pupils are equal, round, and reactive to light. No scleral icterus.  Neck: Neck supple. No tracheal deviation present.  No posterior midline tenderness  Cardiovascular: Normal rate, normal heart sounds and intact distal pulses.   Respiratory: Effort normal and breath sounds normal. No stridor. No respiratory distress. He has no wheezes. He has no rales. He exhibits tenderness.  Abrasion along left lateral chest wall extending down from axilla to lower ribs  GI: Soft. He exhibits no distension. There is no tenderness. There is no rebound and no guarding.  Musculoskeletal:       Arms: Splint left upper extremity, abrasion left chest wall as described above, abrasion left anterior arm  Neurological: He is alert and oriented to person, place, and time. He displays no atrophy and no tremor. He exhibits normal muscle tone. He displays no seizure activity. GCS eye subscore is 4. GCS verbal subscore is 5. GCS motor subscore is 6.  Speech fluent  Skin: Skin is warm.  Psychiatric: He has a normal mood and affect.     Assessment/Plan Struck by heavy bundle of aluminum Left rib fracture 9, 10 with trace pneumothorax - pulmonary toilet, pain control, chest x-ray in a.m. Grade 2 spleen laceration - every 8 hour CBC, should be able to mobilize Left thumb dislocation - Dr. Fredna Dow to consult Left both bones forearm fracture - Dr. Fredna Dow to consult, elevate, splint in place Left chest wall and extremity abrasions  Admit to step down, trauma service  Zenovia Jarred, MD 08/13/2016, 6:35 AM

## 2016-08-13 NOTE — Anesthesia Procedure Notes (Signed)
Procedure Name: LMA Insertion Date/Time: 08/13/2016 1:30 PM Performed by: Gavin PoundLOWDER, Orie Cuttino J Pre-anesthesia Checklist: Patient identified, Emergency Drugs available, Suction available and Patient being monitored Patient Re-evaluated:Patient Re-evaluated prior to inductionOxygen Delivery Method: Circle system utilized Preoxygenation: Pre-oxygenation with 100% oxygen Intubation Type: IV induction Ventilation: Mask ventilation without difficulty LMA: LMA inserted LMA Size: 5.0 Number of attempts: 1 Placement Confirmation: positive ETCO2 and breath sounds checked- equal and bilateral

## 2016-08-13 NOTE — Op Note (Signed)
[id number] 

## 2016-08-13 NOTE — Anesthesia Procedure Notes (Addendum)
Anesthesia Regional Block: Supraclavicular block   Pre-Anesthetic Checklist: ,, timeout performed, Correct Patient, Correct Site, Correct Laterality, Correct Procedure, Correct Position, site marked, Risks and benefits discussed,  Surgical consent,  Pre-op evaluation,  At surgeon's request and post-op pain management  Laterality: Left  Prep: chloraprep       Needles:  Injection technique: Single-shot  Needle Type: Echogenic Stimulator Needle     Needle Length: 9cm  Needle Gauge: 21   Needle insertion depth: 4 cm   Additional Needles:   Procedures: ultrasound guided,,,,,,,,  Narrative:  Start time: 08/13/2016 1:00 PM End time: 08/13/2016 1:12 PM Injection made incrementally with aspirations every 5 mL.  Events:, injection resistance,,,,,,,,,  Performed by: Personally  Anesthesiologist: Leilani AbleHATCHETT, Decorey Wahlert

## 2016-08-13 NOTE — Brief Op Note (Signed)
08/13/2016  3:45 PM  PATIENT:  Charlyn Minervaevin R Hallstrom  [age] y.o. male  PRE-OPERATIVE DIAGNOSIS:  radius fx  POST-OPERATIVE DIAGNOSIS:  radius fx  PROCEDURE:  Procedure(s): OPEN REDUCTION INTERNAL FIXATION (ORIF) DISTAL RADIAL FRACTURE AND ULNA , CLOSED vs OPEN REDUCTION OF THUMB (Left)  SURGEON:  Surgeon(s) and Role:    * Betha LoaKuzma, Decari Duggar, MD - Primary    * Cindee SaltKuzma, Gary, MD - Assisting  PHYSICIAN ASSISTANT:   ASSISTANTS: Cindee SaltGary Ruxin Ransome, MD   ANESTHESIA:   regional and general  EBL:  Total I/O In: 1298.3 [I.V.:1298.3] Out: 550 [Urine:550]  BLOOD ADMINISTERED:none  DRAINS: none   LOCAL MEDICATIONS USED:  NONE  SPECIMEN:  No Specimen  DISPOSITION OF SPECIMEN:  N/A  COUNTS:  YES  TOURNIQUET:   Total Tourniquet Time Documented: Upper Arm (Left) - 115 minutes Total: Upper Arm (Left) - 115 minutes   DICTATION: .Other Dictation: Dictation Number [id number]  PLAN OF CARE: Discharge to home after PACU  PATIENT DISPOSITION:  PACU - hemodynamically stable.   Delay start of Pharmacological VTE agent (>24hrs) due to surgical blood loss or risk of bleeding: no

## 2016-08-13 NOTE — Anesthesia Preprocedure Evaluation (Addendum)
Anesthesia Evaluation  Patient identified by MRN, date of birth, ID band Patient awake    Reviewed: Allergy & Precautions, H&P , NPO status , Patient's Chart, lab work & pertinent test results  Airway Mallampati: I  TM Distance: >3 FB Neck ROM: Full    Dental no notable dental hx. (+) Teeth Intact, Dental Advisory Given   Pulmonary Current Smoker,    Pulmonary exam normal breath sounds clear to auscultation       Cardiovascular negative cardio ROS   Rhythm:Regular Rate:Normal     Neuro/Psych negative neurological ROS  negative psych ROS   GI/Hepatic negative GI ROS, Neg liver ROS,   Endo/Other  negative endocrine ROS  Renal/GU negative Renal ROS  negative genitourinary   Musculoskeletal   Abdominal   Peds  Hematology negative hematology ROS (+)   Anesthesia Other Findings   Reproductive/Obstetrics negative OB ROS                            Anesthesia Physical Anesthesia Plan  ASA: II  Anesthesia Plan: General   Post-op Pain Management:  Regional for Post-op pain   Induction: Intravenous  Airway Management Planned: LMA  Additional Equipment:   Intra-op Plan:   Post-operative Plan: Extubation in OR  Informed Consent: I have reviewed the patients History and Physical, chart, labs and discussed the procedure including the risks, benefits and alternatives for the proposed anesthesia with the patient or authorized representative who has indicated his/her understanding and acceptance.   Dental advisory given  Plan Discussed with: CRNA  Anesthesia Plan Comments:         Anesthesia Quick Evaluation

## 2016-08-13 NOTE — ED Notes (Signed)
Patient transported to CT 

## 2016-08-13 NOTE — ED Notes (Signed)
Pt returned from CT °

## 2016-08-13 NOTE — Consult Note (Signed)
Arthur Clark is an 31 y.o. male.   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  He was not pinned by the object.  Seen at [facility] where XR revealed left radius and ulna fractures and thumb mp dislocation.  Admitted to trauma service at [facility].  Reports throbbing pain of 6/10 severity in forearm, but greater in the thumb.  Alleviated with splint and rest, aggravated by motion and palpation.  Case discussed with Dr. Florina Ou and his note from 08/13/2016 reviewed. Xrays viewed and interpreted by me: thumb and forearm xrays show midshaft radius and ulna fractures with volar angulation.  Thumb dorsal dislocation. Labs reviewed: WBC 15.5  Allergies: No Known Allergies  History reviewed. No pertinent past medical history.  History reviewed. No pertinent surgical history.  Family History: History reviewed. No pertinent family history.  Social History:   reports that he has been smoking.  He has never used smokeless tobacco. He reports that he does not drink alcohol or use drugs.  Medications: No prescriptions prior to admission.    Results for orders placed or performed during the hospital encounter of 08/13/16 (from the past 48 hour(s))  CBC with Differential/Platelet     Status: Abnormal   Collection Time: 08/13/16  2:12 AM  Result Value Ref Range   WBC 15.5 (H) 4.0 - 10.5 K/uL   RBC 5.06 4.22 - 5.81 MIL/uL   Hemoglobin 15.2 13.0 - 17.0 g/dL   HCT 43.7 39.0 - 52.0 %   MCV 86.4 78.0 - 100.0 fL   MCH 30.0 26.0 - 34.0 pg   MCHC 34.8 30.0 - 36.0 g/dL   RDW 13.2 11.5 - 15.5 %   Platelets 306 150 - 400 K/uL   Neutrophils Relative % 59 %   Neutro Abs 9.1 (H) 1.7 - 7.7 K/uL   Lymphocytes Relative 35 %   Lymphs Abs 5.4 (H) 0.7 - 4.0 K/uL   Monocytes Relative 5 %   Monocytes Absolute 0.8 0.1 - 1.0 K/uL   Eosinophils Relative 1 %   Eosinophils Absolute 0.2 0.0 - 0.7 K/uL    Basophils Relative 0 %   Basophils Absolute 0.0 0.0 - 0.1 K/uL  Basic metabolic panel     Status: Abnormal   Collection Time: 08/13/16  2:12 AM  Result Value Ref Range   Sodium 140 135 - 145 mmol/L   Potassium 3.3 (L) 3.5 - 5.1 mmol/L   Chloride 106 101 - 111 mmol/L   CO2 25 22 - 32 mmol/L   Glucose, Bld 157 (H) 65 - 99 mg/dL   BUN 21 (H) 6 - 20 mg/dL   Creatinine, Ser 1.13 0.61 - 1.24 mg/dL   Calcium 9.1 8.9 - 10.3 mg/dL   GFR calc non Af Amer >60 >60 mL/min   GFR calc Af Amer >60 >60 mL/min    Comment: (NOTE) The eGFR has been calculated using the CKD EPI equation. This calculation has not been validated in all clinical situations. eGFR's persistently <60 mL/min signify possible Chronic Kidney Disease.    Anion gap 9 5 - 15    Dg Forearm Left  Result Date: 08/13/2016 CLINICAL DATA:  Construction injury EXAM: LEFT FOREARM - 2 VIEW COMPARISON:  None. FINDINGS: There are comminuted fractures of the midshaft left radius and ulna, predominantly oblique, with volar angulation. IMPRESSION: Comminuted fractures of the midshaft left radius and ulna  with volar angulation. Electronically Signed   By: Ulyses Jarred M.D.   On: 08/13/2016 03:44   Ct Chest W Contrast  Result Date: 08/13/2016 CLINICAL DATA:  Blunt trauma with enlarged pieces of metal fell on top of the patient. Shortness of breath and difficulty breathing. Multiple lacerations and pain on the left side of the body. EXAM: CT CHEST, ABDOMEN, AND PELVIS WITH CONTRAST TECHNIQUE: Multidetector CT imaging of the chest, abdomen and pelvis was performed following the standard protocol during bolus administration of intravenous contrast. CONTRAST:  12m ISOVUE-300 IOPAMIDOL (ISOVUE-300) INJECTION 61% COMPARISON:  None. FINDINGS: CT CHEST FINDINGS Cardiovascular: No significant vascular findings. Normal heart size. No pericardial effusion. Mediastinum/Nodes: No enlarged mediastinal, hilar, or axillary lymph nodes. Thyroid gland, trachea,  and esophagus demonstrate no significant findings. Lungs/Pleura: There are left basilar opacities subjacent to multiple rib fractures, likely pulmonary contusion. There is a trace left anterior pneumothorax, (series 3, image 162). This measures approximately 3 mm. Musculoskeletal: There are nondisplaced fractures of the lateral left ninth and tenth ribs with small amounts of adjacent soft tissue emphysema. CT ABDOMEN PELVIS FINDINGS Hepatobiliary: No focal liver abnormality is seen. No gallstones, gallbladder wall thickening, or biliary dilatation. Pancreas: Unremarkable. No pancreatic ductal dilatation or surrounding inflammatory changes. Spleen: There is a wedge-shaped focus of hypoattenuation at the posterior aspect of the spleen. There is a hematoma within the subcutaneous fat overlying the spleen, an a fracture of the adjacent left tenth rib. Adrenals/Urinary Tract: Adrenal glands are unremarkable. Kidneys are normal, without renal calculi, focal lesion, or hydronephrosis. Bladder is unremarkable. Stomach/Bowel: Stomach is within normal limits. Appendix appears normal. No evidence of bowel wall thickening, distention, or inflammatory changes. Vascular/Lymphatic: No significant vascular findings are present. No enlarged abdominal or pelvic lymph nodes. Reproductive: Prostate is unremarkable. Other: Hematoma within the left flank subcutaneous fat. Musculoskeletal: There is no fracture of the pelvis. At the posterior inferior corner of the L4 vertebral body, there is a well corticated osseous fragment indenting the ventral thecal sac. This fragment is well corticated and appears chronic. No lumbar spine fracture is identified. Fractures of the left radius and ulna are better characterized on concomitant radiographs. IMPRESSION: 1. Focal hypoattenuation within the posterolateral aspect of the spleen, measuring up to 2 cm in depth, consistent with grade 2 splenic laceration. 2. Nondisplaced fractures of the left  ninth and tenth ribs with associated soft tissue emphysema and contusion of the underlying lung. 3. Trace left anterior pneumothorax measuring approximately 3 mm. 4. Corticated osseous fragment at the inferior posterior corner of the L4 vertebral body, impressing on the ventral thecal sac. This has a chronic appearance and may be related to remote trauma or herniation of the nucleus pulposis through the endplate. 5. Fractures of the left radius and ulna are better characterized on concomitant radiographs. 6. Left flank hematoma within the subcutaneous fat overlying the spleen. Critical Value/emergent results were called by telephone at the time of interpretation on 08/13/2016 at 3:40 am to Dr. JShanon Rosser, who verbally acknowledged these results. Electronically Signed   By: KUlyses JarredM.D.   On: 08/13/2016 03:42   Ct Abdomen Pelvis W Contrast  Result Date: 08/13/2016 CLINICAL DATA:  Blunt trauma with enlarged pieces of metal fell on top of the patient. Shortness of breath and difficulty breathing. Multiple lacerations and pain on the left side of the body. EXAM: CT CHEST, ABDOMEN, AND PELVIS WITH CONTRAST TECHNIQUE: Multidetector CT imaging of the chest, abdomen and pelvis was performed following the standard  protocol during bolus administration of intravenous contrast. CONTRAST:  152m ISOVUE-300 IOPAMIDOL (ISOVUE-300) INJECTION 61% COMPARISON:  None. FINDINGS: CT CHEST FINDINGS Cardiovascular: No significant vascular findings. Normal heart size. No pericardial effusion. Mediastinum/Nodes: No enlarged mediastinal, hilar, or axillary lymph nodes. Thyroid gland, trachea, and esophagus demonstrate no significant findings. Lungs/Pleura: There are left basilar opacities subjacent to multiple rib fractures, likely pulmonary contusion. There is a trace left anterior pneumothorax, (series 3, image 162). This measures approximately 3 mm. Musculoskeletal: There are nondisplaced fractures of the lateral left ninth and  tenth ribs with small amounts of adjacent soft tissue emphysema. CT ABDOMEN PELVIS FINDINGS Hepatobiliary: No focal liver abnormality is seen. No gallstones, gallbladder wall thickening, or biliary dilatation. Pancreas: Unremarkable. No pancreatic ductal dilatation or surrounding inflammatory changes. Spleen: There is a wedge-shaped focus of hypoattenuation at the posterior aspect of the spleen. There is a hematoma within the subcutaneous fat overlying the spleen, an a fracture of the adjacent left tenth rib. Adrenals/Urinary Tract: Adrenal glands are unremarkable. Kidneys are normal, without renal calculi, focal lesion, or hydronephrosis. Bladder is unremarkable. Stomach/Bowel: Stomach is within normal limits. Appendix appears normal. No evidence of bowel wall thickening, distention, or inflammatory changes. Vascular/Lymphatic: No significant vascular findings are present. No enlarged abdominal or pelvic lymph nodes. Reproductive: Prostate is unremarkable. Other: Hematoma within the left flank subcutaneous fat. Musculoskeletal: There is no fracture of the pelvis. At the posterior inferior corner of the L4 vertebral body, there is a well corticated osseous fragment indenting the ventral thecal sac. This fragment is well corticated and appears chronic. No lumbar spine fracture is identified. Fractures of the left radius and ulna are better characterized on concomitant radiographs. IMPRESSION: 1. Focal hypoattenuation within the posterolateral aspect of the spleen, measuring up to 2 cm in depth, consistent with grade 2 splenic laceration. 2. Nondisplaced fractures of the left ninth and tenth ribs with associated soft tissue emphysema and contusion of the underlying lung. 3. Trace left anterior pneumothorax measuring approximately 3 mm. 4. Corticated osseous fragment at the inferior posterior corner of the L4 vertebral body, impressing on the ventral thecal sac. This has a chronic appearance and may be related to  remote trauma or herniation of the nucleus pulposis through the endplate. 5. Fractures of the left radius and ulna are better characterized on concomitant radiographs. 6. Left flank hematoma within the subcutaneous fat overlying the spleen. Critical Value/emergent results were called by telephone at the time of interpretation on 08/13/2016 at 3:40 am to Dr. JShanon Rosser, who verbally acknowledged these results. Electronically Signed   By: KUlyses JarredM.D.   On: 08/13/2016 03:42   Dg Hand Complete Left  Result Date: 08/13/2016 CLINICAL DATA:  Construction injury EXAM: LEFT HAND - COMPLETE 3+ VIEW COMPARISON:  None. FINDINGS: There is lateral dislocation at the left metacarpophalangeal joint of the thumb. There is a small adjacent osseous fragment that likely indicates fracture, but the donor site is not identified. There is no other fracture of the left hand. IMPRESSION: Lateral dislocation at the left first metacarpophalangeal joint. An associated chip fracture is suspected, but the donor site is not visualized. Electronically Signed   By: KUlyses JarredM.D.   On: 08/13/2016 03:46     A comprehensive review of systems was negative. Review of Systems: No fevers, chills, night sweats, chest pain, shortness of breath, nausea, vomiting, diarrhea, constipation, easy bleeding or bruising, headaches, dizziness, vision changes, fainting.   Blood pressure 133/73, pulse 74, temperature 98.9 F (37.2 C),  temperature source Oral, resp. rate 15, height 6' 4"  (1.93 m), weight 84.3 kg (185 lb 13.6 oz), SpO2 98 %.  General appearance: alert, cooperative and appears stated age Head: Normocephalic, without obvious abnormality, atraumatic Neck: supple, symmetrical, trachea midline Extremities: Intact sensation and capillary refill all digits except decreased sensation in thumb.  +epl/fpl/io.  No wounds. Able to flex and extend all digits without pain.  Compartments soft.  Swelling dorsally just proximal to fracture  site.  Deformity at thumb mp joint Pulses: 2+ and symmetric Skin: Skin color, texture, turgor normal. No rashes or lesions Neurologic: Grossly normal Incision/Wound: Abrasions on arm  Assessment/Plan Left radius and ulna fractures and thumb dorsal mp joint dislocation.  No compartment syndrome at this time.  Recommend OR for ORIF radius and ulna and closed vs open reduction of thumb mp joint.  Risks, benefits and alternatives of surgery discussed including risks of blood loss, infection, damage to nerves/vessels/tendons/ligament/bone, failure of surgery, need for additional surgery, complication with wound healing, nonunion, malunion, stiffness.  He voiced understanding of these risks and elected to proceed.  We will arrange it for today.  Gunnard Dorrance R 08/13/2016, 7:17 AM

## 2016-08-13 NOTE — Transfer of Care (Signed)
Immediate Anesthesia Transfer of Care Note  Patient: Arthur Clark  Procedure(s) Performed: Procedure(s): OPEN REDUCTION INTERNAL FIXATION (ORIF) DISTAL RADIAL FRACTURE AND ULNA , CLOSED vs OPEN REDUCTION OF THUMB (Left)  Patient Location: PACU  Anesthesia Type:General and Regional  Level of Consciousness: awake, oriented, drowsy and patient cooperative  Airway & Oxygen Therapy: Patient Spontanous Breathing and Patient connected to nasal cannula oxygen  Post-op Assessment: Report given to RN, Post -op Vital signs reviewed and stable and Patient moving all extremities  Post vital signs: Reviewed and stable  Last Vitals:  Vitals:   08/13/16 1310 08/13/16 1600  BP: (!) 115/54 118/67  Pulse: 70 (!) 56  Resp: 11 11  Temp:  36.9 C    Last Pain:  Vitals:   08/13/16 1600  TempSrc:   PainSc: (P) 0-No pain      Patients Stated Pain Goal: 3 (08/13/16 1136)  Complications: No apparent anesthesia complications

## 2016-08-14 ENCOUNTER — Inpatient Hospital Stay (HOSPITAL_COMMUNITY): Payer: Worker's Compensation

## 2016-08-14 LAB — BASIC METABOLIC PANEL
ANION GAP: 5 (ref 5–15)
BUN: 7 mg/dL (ref 6–20)
CO2: 23 mmol/L (ref 22–32)
Calcium: 8.2 mg/dL — ABNORMAL LOW (ref 8.9–10.3)
Chloride: 107 mmol/L (ref 101–111)
Creatinine, Ser: 0.94 mg/dL (ref 0.61–1.24)
GLUCOSE: 122 mg/dL — AB (ref 65–99)
POTASSIUM: 3.7 mmol/L (ref 3.5–5.1)
SODIUM: 135 mmol/L (ref 135–145)

## 2016-08-14 LAB — HIV ANTIBODY (ROUTINE TESTING W REFLEX): HIV SCREEN 4TH GENERATION: NONREACTIVE

## 2016-08-14 LAB — CBC
HEMATOCRIT: 37.7 % — AB (ref 39.0–52.0)
HEMOGLOBIN: 12.3 g/dL — AB (ref 13.0–17.0)
MCH: 28.9 pg (ref 26.0–34.0)
MCHC: 32.6 g/dL (ref 30.0–36.0)
MCV: 88.5 fL (ref 78.0–100.0)
Platelets: 173 10*3/uL (ref 150–400)
RBC: 4.26 MIL/uL (ref 4.22–5.81)
RDW: 13.4 % (ref 11.5–15.5)
WBC: 9.8 10*3/uL (ref 4.0–10.5)
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
## 2016-08-14 NOTE — Progress Notes (Signed)
Orthopedic Tech Progress Note Patient Details:  Arthur MinervaDevin R Clark 1985/10/26 [id number]  Ortho Devices Type of Ortho Device: Sling immobilizer   Saul FordyceJennifer C Laurance Heide 08/14/2016, 12:49 PM

## 2016-08-15 MED ORDER — OXYCODONE HCL 5 MG PO TABS: 5.0000 mg | ORAL_TABLET | ORAL | 0 refills | Status: AC | PRN
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
## 2016-08-15 NOTE — Discharge Instructions (Signed)
Incentive spirometer - use a couple of times an hour when awake Ambulate as tolerated May shower - keep splint dry Keep left arm elevated on pillows when in bed or sitting down

## 2016-08-16 ENCOUNTER — Encounter (HOSPITAL_COMMUNITY): Payer: Self-pay | Admitting: Orthopedic Surgery

## 2016-08-30 NOTE — Addendum Note (Signed)
Addendum  created 08/30/16 1536 by Shanah Guimaraes, MD   Sign clinical note    
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
## 2018-01-29 IMAGING — CR DG CHEST 1V PORT
1 series · 1 of 1 positions shown · non-contrast
Comparison: CT chest 08/13/2016.

CLINICAL DATA: Multiple left rib fractures, initial encounter.

EXAM:
PORTABLE CHEST 1 VIEW

[AP]
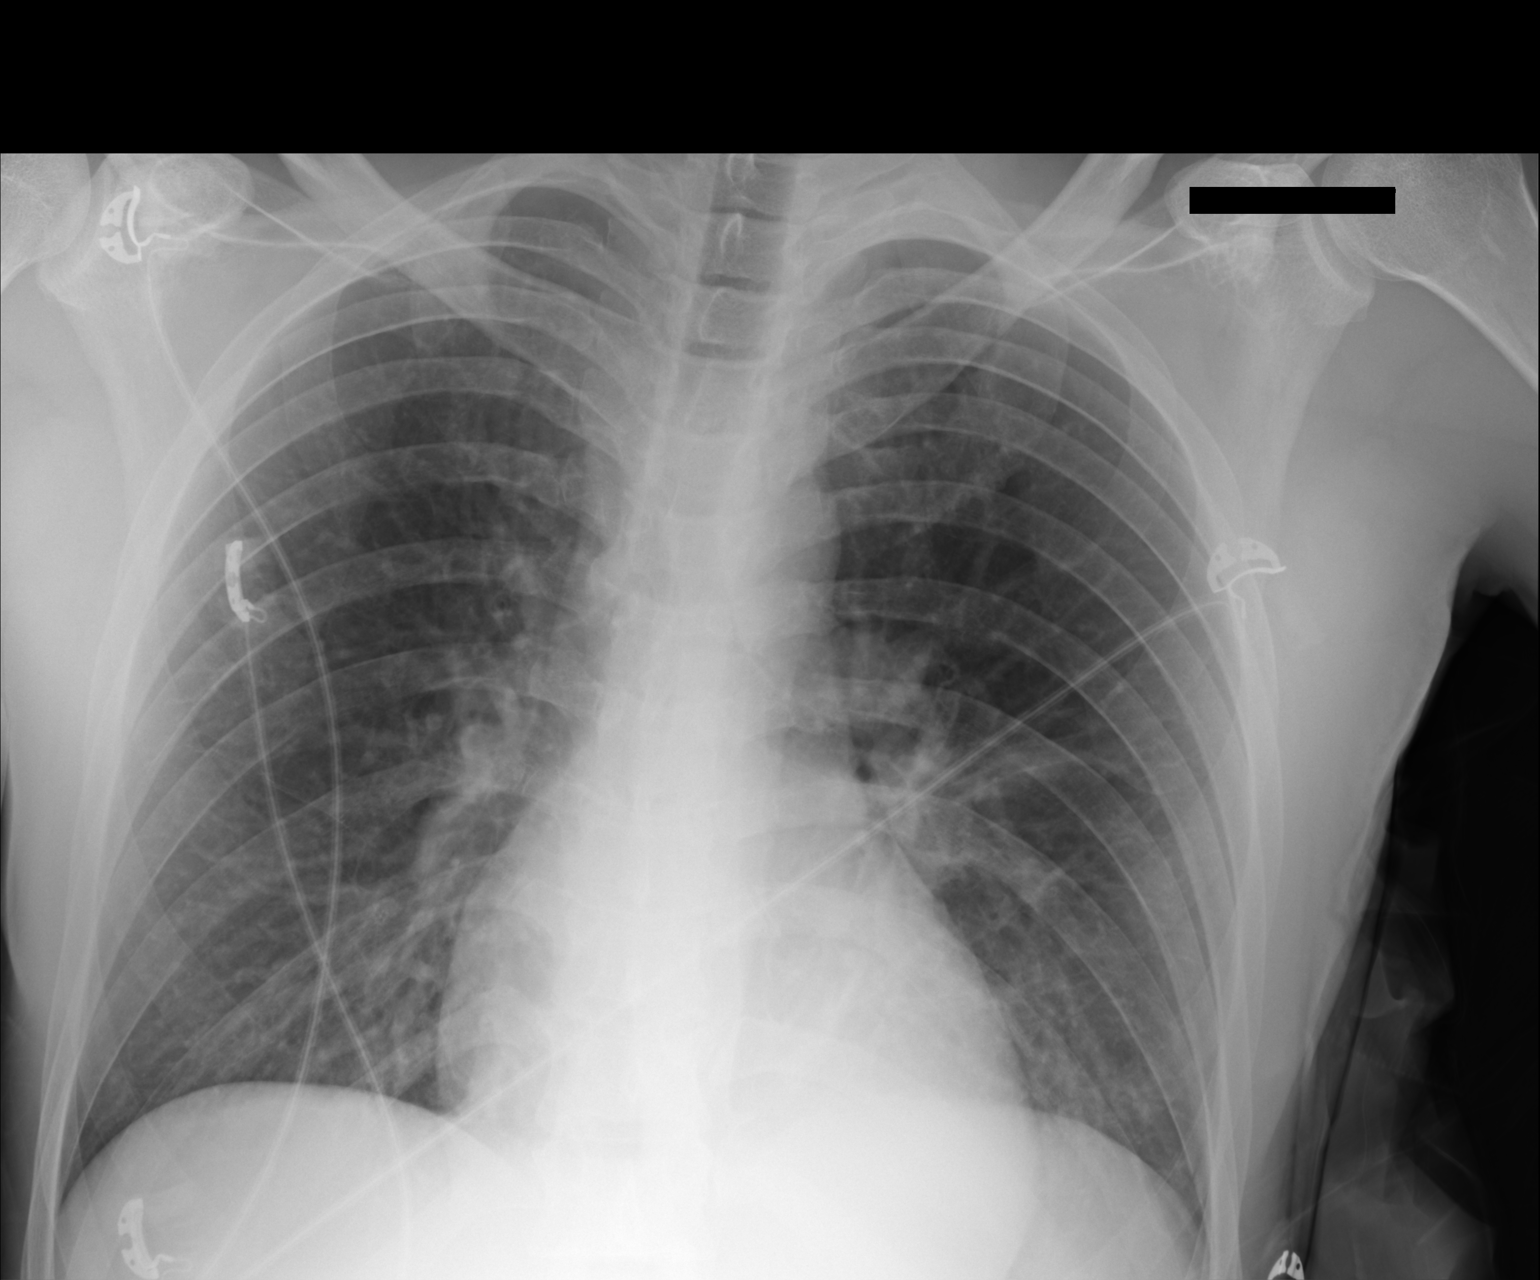

[1 of 1 positions shown; findings below may reference images not displayed]

FINDINGS: Trachea is midline. Heart size normal. Mild left basilar airspace
opacification. No pleural fluid. Left hemidiaphragm is mildly
elevated. Nondisplaced fractures of lower left ribs are better seen
on yesterday's exam. No pneumothorax.
IMPRESSION: Left basilar atelectasis or contusion. Left-sided rib fractures are
better seen on yesterday's exam.
